# Patient Record
Sex: Male | Born: 1970
Health system: Southern US, Community
[De-identification: ages and names within clinical notes are randomized; demographics above are authoritative.]

---

## 2001-04-29 ENCOUNTER — Encounter: Payer: Self-pay | Admitting: Emergency Medicine

## 2001-04-29 ENCOUNTER — Emergency Department (HOSPITAL_COMMUNITY): Admission: EM | Admit: 2001-04-29 | Discharge: 2001-04-29 | Payer: Self-pay

## 2001-05-04 ENCOUNTER — Ambulatory Visit (HOSPITAL_COMMUNITY): Admission: RE | Admit: 2001-05-04 | Discharge: 2001-05-04 | Payer: Self-pay | Admitting: Orthopedic Surgery

## 2001-05-05 ENCOUNTER — Encounter: Payer: Self-pay | Admitting: Orthopedic Surgery

## 2001-05-05 ENCOUNTER — Ambulatory Visit (HOSPITAL_COMMUNITY): Admission: RE | Admit: 2001-05-05 | Discharge: 2001-05-05 | Payer: Self-pay | Admitting: Orthopedic Surgery

## 2005-05-03 ENCOUNTER — Emergency Department (HOSPITAL_COMMUNITY): Admission: EM | Admit: 2005-05-03 | Discharge: 2005-05-03 | Payer: Self-pay | Admitting: Emergency Medicine
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
## 2014-12-14 DIAGNOSIS — M79652 Pain in left thigh: Secondary | ICD-10-CM | POA: Diagnosis not present

## 2014-12-14 DIAGNOSIS — M25562 Pain in left knee: Secondary | ICD-10-CM | POA: Insufficient documentation

## 2014-12-14 DIAGNOSIS — M79605 Pain in left leg: Secondary | ICD-10-CM | POA: Insufficient documentation

## 2014-12-15 ENCOUNTER — Ambulatory Visit (HOSPITAL_COMMUNITY)
Admission: RE | Admit: 2014-12-15 | Discharge: 2014-12-15 | Disposition: A | Discharge status: Home / Self Care | Payer: BLUE CROSS/BLUE SHIELD | Source: Ambulatory Visit | Attending: Emergency Medicine | Admitting: Emergency Medicine

## 2014-12-15 ENCOUNTER — Encounter (HOSPITAL_COMMUNITY): Payer: Self-pay | Admitting: Emergency Medicine

## 2014-12-15 ENCOUNTER — Emergency Department (HOSPITAL_COMMUNITY)
Admission: EM | Admit: 2014-12-15 | Discharge: 2014-12-15 | Disposition: A | Discharge status: Home / Self Care | Payer: BLUE CROSS/BLUE SHIELD | Attending: Emergency Medicine | Admitting: Emergency Medicine

## 2014-12-15 DIAGNOSIS — M79605 Pain in left leg: Secondary | ICD-10-CM | POA: Diagnosis present

## 2014-12-15 DIAGNOSIS — M79609 Pain in unspecified limb: Secondary | ICD-10-CM | POA: Diagnosis not present

## 2014-12-15 MED ORDER — IBUPROFEN 600 MG PO TABS: 600.0000 mg | ORAL_TABLET | Freq: Four times a day (QID) | ORAL | Status: DC | PRN

## 2014-12-15 NOTE — ED Provider Notes (Signed)
CSN: [id number]     Arrival date & time 12/14/14  2355 History   First MD Initiated Contact with Patient 12/15/14 0003     No chief complaint on file.    (Consider location/radiation/quality/duration/timing/severity/associated sxs/prior Treatment) HPI  Pt is a 44yo male presenting to ED with c/o intermittent Left leg pain for 2 weeks. Reports mild aching, soreness, 8/10 at worst.  Pain worse with ambulation. No pain medication tried.  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 Denies recent falls or injuries. Denies swelling, redness, or numbness of Left leg. Denies chest pain or SOB. Denies abdominal pain, n/v/d. Denies fever or chills. Denies hx of blood clots. Denies smoking. No other significant PMH.  History reviewed. No pertinent past medical history. History reviewed. No pertinent past surgical history. No family history on file. History  Substance Use Topics  . Smoking status: Never Smoker   . Smokeless tobacco: Not on file  . Alcohol Use: No    Review of Systems  Constitutional: Negative for fever and chills.  Musculoskeletal: Positive for myalgias and gait problem ( occasional limp due to pain). Negative for back pain, joint swelling and arthralgias.  Skin: Negative for color change, pallor, rash and wound.  Neurological: Negative for weakness and numbness.  All other systems reviewed and are negative.     Allergies  Review of patient's allergies indicates no known allergies.  Home Medications   Prior to Admission medications   Medication Sig Start Date End Date Taking? Authorizing Provider  ibuprofen (ADVIL,MOTRIN) 600 MG tablet Take 1 tablet (600 mg total) by mouth every 6 (six) hours as needed. 12/15/14   Junius Finner, PA-C   BP 131/91 mmHg  Pulse 72  Temp(Src) 97.9 F (36.6 C) (Oral)  Resp 20  SpO2 99% Physical Exam  Constitutional: He is oriented to person, place, and time. He appears well-developed and well-nourished.  HENT:  Head:  Normocephalic and atraumatic.  Eyes: EOM are normal.  Neck: Normal range of motion.  Cardiovascular: Normal rate.   Pulses:      Dorsalis pedis pulses are 2+ on the left side.  Pulmonary/Chest: Effort normal.  Musculoskeletal: Normal range of motion. He exhibits tenderness. He exhibits no edema.  Left leg: no edema or obvious deformity.  Mild tenderness to posterior aspect Left knee and medial thigh. FROM Left hip and Left knee.  No tenderness to Left hip or Left ankle. No tenderness to Left calf.  Neurological: He is alert and oriented to person, place, and time.  Left leg: sensation in tact, symmetric compared to Right leg  Skin: Skin is warm and dry. No rash noted. No erythema. No pallor.  Psychiatric: He has a normal mood and affect. His behavior is normal.  Nursing note and vitals reviewed.   ED Course  Procedures (including critical care time) Labs Review Labs Reviewed - No data to display  Imaging Review No results found.   EKG Interpretation None      MDM   Final diagnoses:  Left leg pain   Pt is a 44yo male presenting to ED with c/o intermittent Left leg pain.  Denies CP or SOB. Pt does have mild tenderness to posterior left knee and medial thigh. No erythema, warmth or edema.  Left leg is neurovascularly in tact.  No hx of recent injury. No evidence of underlying infection.  No indication for plain films at this time.  Pain likely muscular in nature.  Low suspicion for DVT, however, due  to hx of 12-16 hour stents of driving, will have pt return in the morning for venous doppler.  Home care instructions provided. Return precautions provided. Pt verbalized understanding and agreement with tx plan.   Junius FinnerErin O'Malley, PA-C 12/15/14 [phone]  Derwood KaplanAnkit Nanavati, MD 12/16/14 [phone]

## 2014-12-15 NOTE — ED Notes (Signed)
Pt c/o LLE pain x 2 weeks; ambulatory with mild discomfort; denies injury

## 2014-12-15 NOTE — Progress Notes (Signed)
VASCULAR LAB PRELIMINARY  PRELIMINARY  PRELIMINARY  PRELIMINARY  Left lower extremity venous Doppler completed.    Preliminary report:  There is no DVT or SVT noted in the left lower extremity.   Theresa Dohrman, RVT 12/15/2014, 9:26 AM

## 2014-12-15 NOTE — ED Notes (Signed)
Pt. reports intermittent left leg pain for 2 weeks , denies injury , ambulatory .

## 2015-05-14 ENCOUNTER — Encounter (HOSPITAL_BASED_OUTPATIENT_CLINIC_OR_DEPARTMENT_OTHER): Payer: Self-pay

## 2015-05-14 ENCOUNTER — Emergency Department (HOSPITAL_BASED_OUTPATIENT_CLINIC_OR_DEPARTMENT_OTHER)
Admission: EM | Admit: 2015-05-14 | Discharge: 2015-05-14 | Disposition: A | Discharge status: Home / Self Care | Payer: BLUE CROSS/BLUE SHIELD | Attending: Emergency Medicine | Admitting: Emergency Medicine

## 2015-05-14 DIAGNOSIS — M25512 Pain in left shoulder: Secondary | ICD-10-CM

## 2015-05-14 MED ORDER — HYDROCODONE-ACETAMINOPHEN 5-325 MG PO TABS: 1.0000 | ORAL_TABLET | Freq: Four times a day (QID) | ORAL | Status: AC | PRN

## 2015-05-14 MED ORDER — NAPROXEN SODIUM 550 MG PO TABS: ORAL_TABLET | ORAL | Status: AC

## 2015-05-14 NOTE — ED Notes (Signed)
Left shoulder pain after waking up on Monday, increasing pain that is moving down his arm and c/o decreased ROM.

## 2015-05-14 NOTE — ED Provider Notes (Signed)
CSN: [id number]     Arrival date & time 05/14/15  2107 History  By signing my name below, I, [name], attest that this documentation has been prepared under the direction and in the presence of Paula LibraJohn Isaiah Cianci, MD. Electronically Signed: Evon Slackerrance Branch, ED Scribe. 05/14/2015. 11:02 PM.      Chief Complaint  Patient presents with  . Shoulder Pain   Patient is a 44 y.o. male presenting with shoulder pain. The history is provided by the patient. No language interpreter was used.  Shoulder Pain  HPI Comments: Matthew Schneider is a 44 y.o. male who presents to the Emergency Department complaining of left arm pain onset 2 days prior. Pt states that he awoke and there was pain. Pt denies injury or trauma to the shoulder. Pt states that when lifting the arm the pain is severe. He has difficulty localizing the pain but states it is not really in the shoulder but in the entire left arm. There is some equivocal change in pain with movement of the neck but movement of the shoulders for possible exacerbating factor. Pt states that he has tried Aleve and [employer] that provided temporary relief. There is no associated numbness or weakness.   History reviewed. No pertinent past medical history. History reviewed. No pertinent past surgical history. No family history on file. Social History  Substance Use Topics  . Smoking status: Never Smoker   . Smokeless tobacco: None  . Alcohol Use: No    Review of Systems  Musculoskeletal: Positive for myalgias.  Neurological: Negative for numbness.  All other systems reviewed and are negative.   Allergies  Review of patient's allergies indicates no known allergies.  Home Medications   Prior to Admission medications   Medication Sig Start Date End Date Taking? Authorizing Provider  HYDROcodone-acetaminophen (NORCO) 5-325 MG tablet Take 1-2 tablets by mouth every 6 (six) hours as needed for severe pain. 05/14/15   Cyndia Degraff, MD  naproxen sodium  (ANAPROX DS) 550 MG tablet Take 1 tablet every 12 hours for shoulder pain. Best taken with a meal. 05/14/15   Deontra Pereyra, MD   BP 130/86 mmHg  Pulse 72  Temp(Src) 98.2 F (36.8 C) (Oral)  Resp 20  Ht 6\' 1"  (1.854 m)  Wt 215 lb (97.523 kg)  BMI 28.37 kg/m2  SpO2 97%   Physical Exam  Nursing note and vitals reviewed. General: Well-developed, well-nourished male in no acute distress; appearance consistent with age of record HENT: normocephalic; atraumatic Eyes: pupils equal, round and reactive to light; extraocular muscles intact Neck: supple Heart: regular rate and rhythm Lungs: clear to auscultation bilaterally Abdomen: soft; nondistended; nontender; no masses or hepatosplenomegaly; bowel sounds present Extremities: No deformity; full range of motion except left shoulder due to pain; pulses normal; no edema or discoloration of the left shoulder or arm and the left upper extremity is NVI distally Neurologic: Awake, alert and oriented; motor function intact in all extremities and symmetric; no facial droop Skin: Warm and dry Psychiatric: Normal mood and affect  ED Course  Procedures (including critical care time) DIAGNOSTIC STUDIES: Oxygen Saturation is 97% on RA, normal by my interpretation.    COORDINATION OF CARE: 10:57 PM-Discussed treatment plan with pt at bedside and pt agreed to plan.   Patient will follow-up with [facility]   MDM   Final diagnoses:  Shoulder pain, acute, left   I personally performed the services described in this documentation, which was scribed in my presence. The recorded information  has been reviewed and is accurate.     Paula Libra, MD 05/14/15 [phone]

## 2015-05-14 NOTE — ED Notes (Signed)
Woke w left shoulder pain on Sunday  Getting worse,  Denies inj

## 2018-01-26 ENCOUNTER — Ambulatory Visit (INDEPENDENT_AMBULATORY_CARE_PROVIDER_SITE_OTHER): Payer: Self-pay | Admitting: Orthopaedic Surgery

## 2018-11-03 ENCOUNTER — Other Ambulatory Visit: Payer: Self-pay | Admitting: Gastroenterology

## 2018-11-03 DIAGNOSIS — R1902 Left upper quadrant abdominal swelling, mass and lump: Secondary | ICD-10-CM

## 2018-11-09 ENCOUNTER — Ambulatory Visit
Admission: RE | Admit: 2018-11-09 | Discharge: 2018-11-09 | Disposition: A | Discharge status: Home / Self Care | Payer: BLUE CROSS/BLUE SHIELD | Source: Ambulatory Visit | Attending: Gastroenterology | Admitting: Gastroenterology

## 2018-11-09 ENCOUNTER — Other Ambulatory Visit: Payer: Self-pay

## 2018-11-09 DIAGNOSIS — R1902 Left upper quadrant abdominal swelling, mass and lump: Secondary | ICD-10-CM

## 2018-11-09 MED ORDER — IOPAMIDOL (ISOVUE-300) INJECTION 61%
125.0000 mL | Freq: Once | INTRAVENOUS | Status: AC | PRN
  Administered 2018-11-09: 14:00:00 125 mL via INTRAVENOUS

## 2019-10-20 ENCOUNTER — Ambulatory Visit: Payer: BC Managed Care – PPO | Attending: Internal Medicine

## 2019-10-20 DIAGNOSIS — Z23 Encounter for immunization: Secondary | ICD-10-CM

## 2019-10-20 NOTE — Progress Notes (Signed)
   Covid-19 Vaccination Clinic  Name:  Matthew Schneider    MRN: [id number] DOB: 07-14-1971  10/20/2019  [name] was observed post Covid-19 immunization for 15 minutes without incident. He was provided with Vaccine Information Sheet and instruction to access the V-Safe system.   [name] was instructed to call 911 with any severe reactions post vaccine: Marland Kitchen Difficulty breathing  . Swelling of face and throat  . A fast heartbeat  . A bad rash all over body  . Dizziness and weakness   Immunizations Administered    Name Date Dose VIS Date Route   Pfizer COVID-19 Vaccine 10/20/2019  9:33 AM 0.3 mL 06/29/2019 Intramuscular   Manufacturer: [employer], [employer]   Lot: [id number]   NDC: [address]-7

## 2019-11-13 ENCOUNTER — Ambulatory Visit: Payer: BC Managed Care – PPO | Attending: Internal Medicine

## 2019-11-13 DIAGNOSIS — Z23 Encounter for immunization: Secondary | ICD-10-CM

## 2019-11-13 NOTE — Progress Notes (Signed)
   Covid-19 Vaccination Clinic  Name:  Matthew Schneider    MRN: [id number] DOB: 1971-01-23  11/13/2019  Mr. Volante was observed post Covid-19 immunization for 15 minutes without incident. He was provided with Vaccine Information Sheet and instruction to access the V-Safe system.   Mr. O[place]aca was instructed to call 911 with any severe reactions post vaccine: Marland Kitchen Difficulty breathing  . Swelling of face and throat  . A fast heartbeat  . A bad rash all over body  . Dizziness and weakness   Immunizations Administered    Name Date Dose VIS Date Route   Pfizer COVID-19 Vaccine 11/13/2019  4:22 PM 0.3 mL 09/12/2018 Intramuscular   Manufacturer: [employer], [employer]   Lot: [id number]   NDC: [address]-5

## 2020-03-16 IMAGING — CT CT ABDOMEN WITH CONTRAST
2 of 3 series · 13 of 32 positions shown, 19 images · IV contrast (APPLIED)
Comparison: None.

CLINICAL DATA: Left upper quadrant abdominal mass.

EXAM:
CT ABDOMEN WITH CONTRAST
TECHNIQUE: Multidetector CT imaging of the abdomen was performed using the
standard protocol following bolus administration of intravenous
contrast.
CONTRAST:  125mL [id number] IOPAMIDOL ([id number]) INJECTION 61%

[Series 2: abdroutine 5.0 i40s 1 · axial · 0.80mm/px · z∈[-323,-83]mm · 9 of 60 slices shown, 15 images]
[im 6/60  soft-tissue]
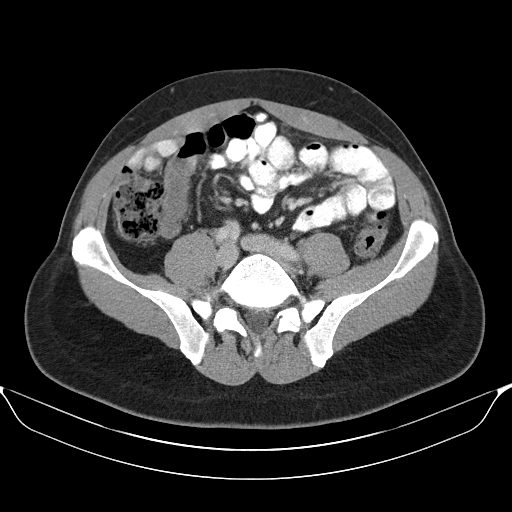
[im 6/60  bone]
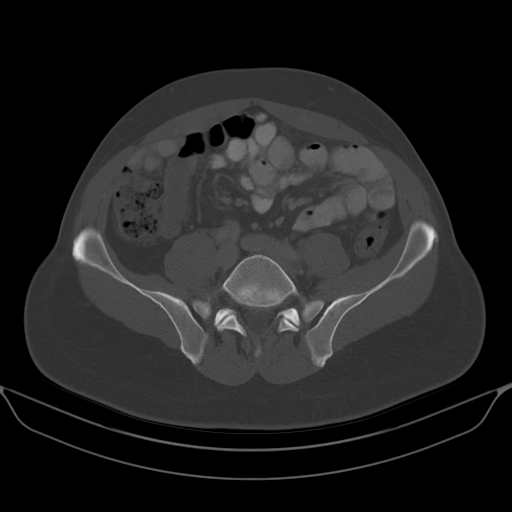
[im 12/60  soft-tissue]
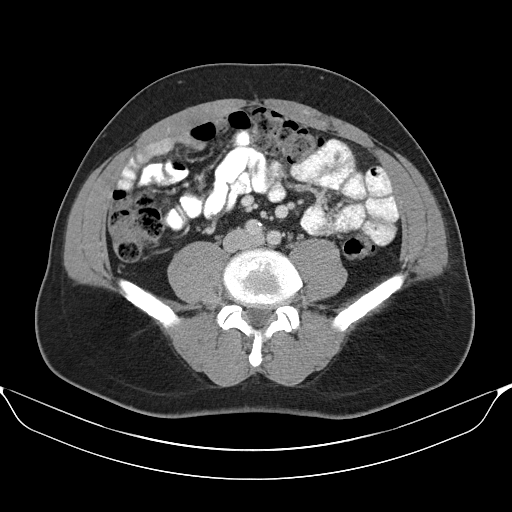
[im 18/60  soft-tissue]
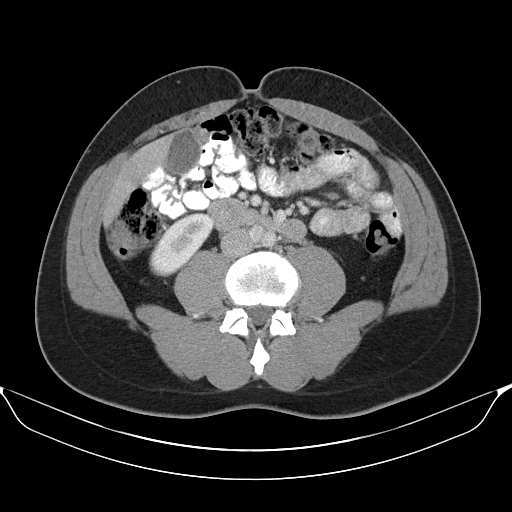
[im 24/60  soft-tissue]
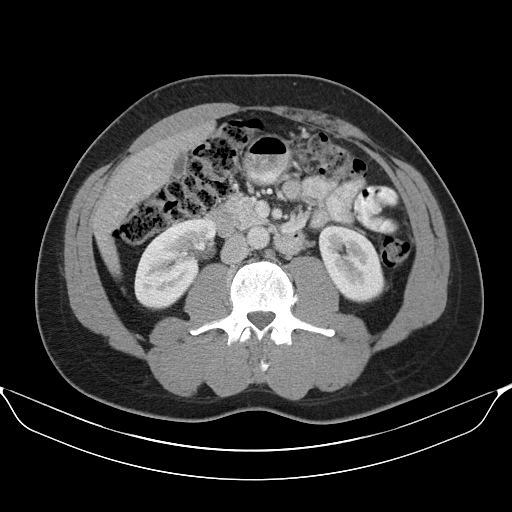
[im 30/60  soft-tissue]
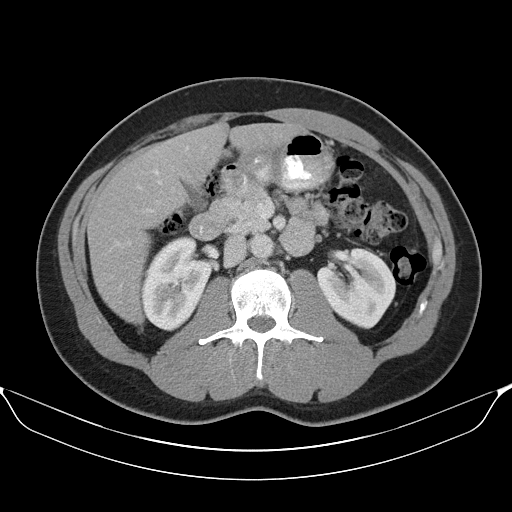
[im 36/60  soft-tissue]
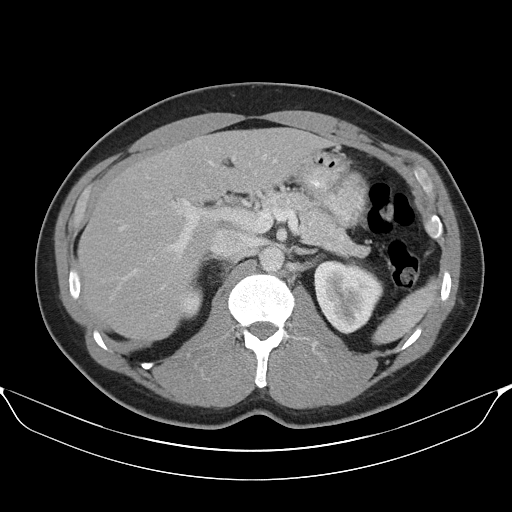
[im 36/60  lung]
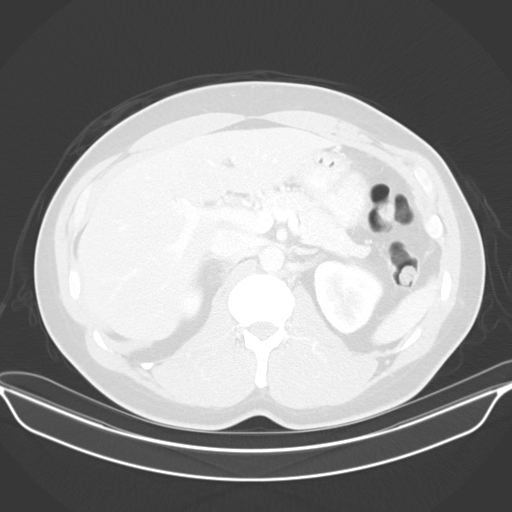
[im 42/60  soft-tissue]
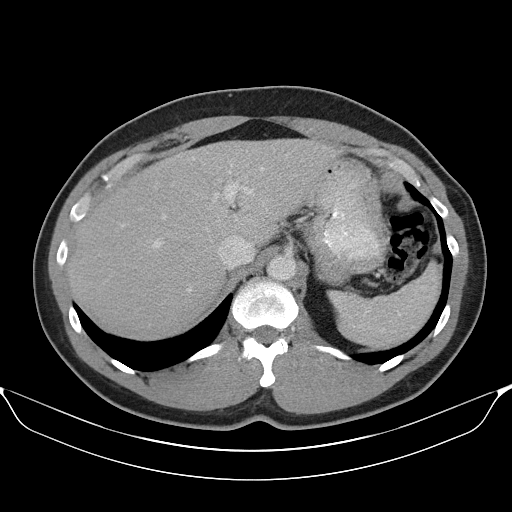
[im 42/60  lung]
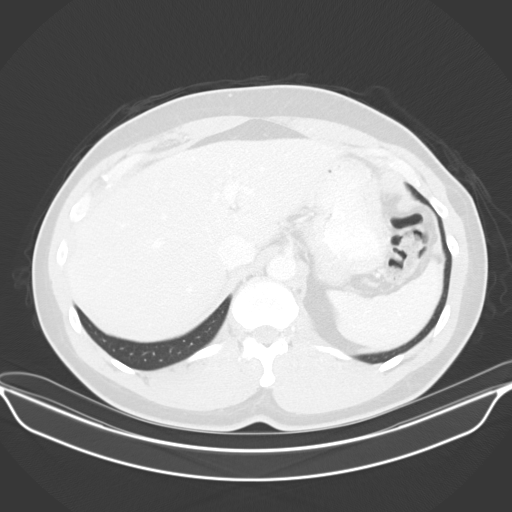
[im 48/60  soft-tissue]
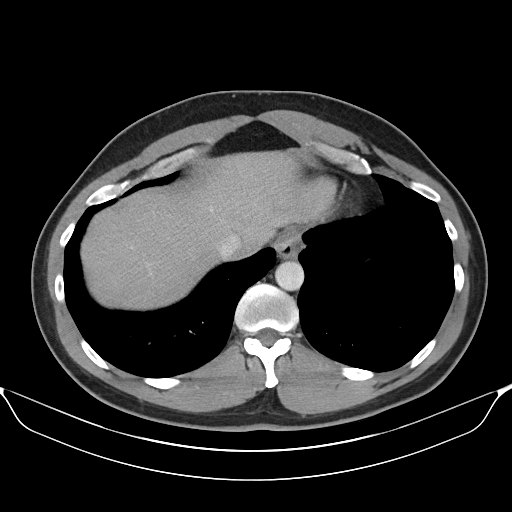
[im 48/60  lung]
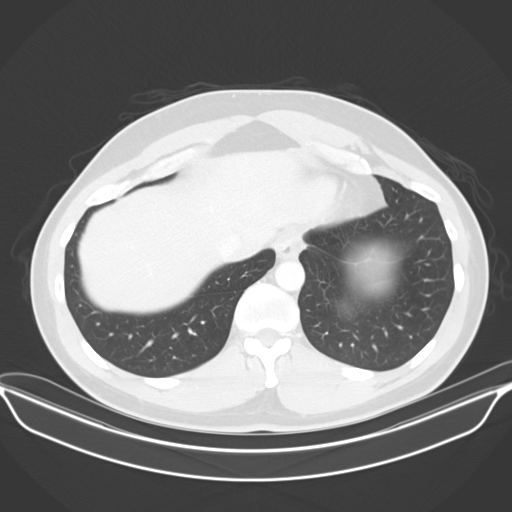
[im 54/60  soft-tissue]
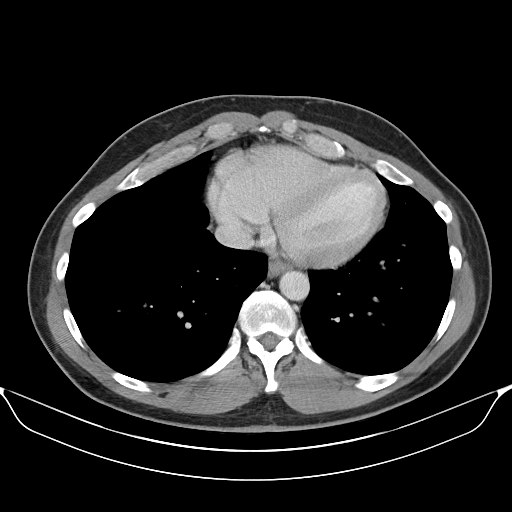
[im 54/60  lung]
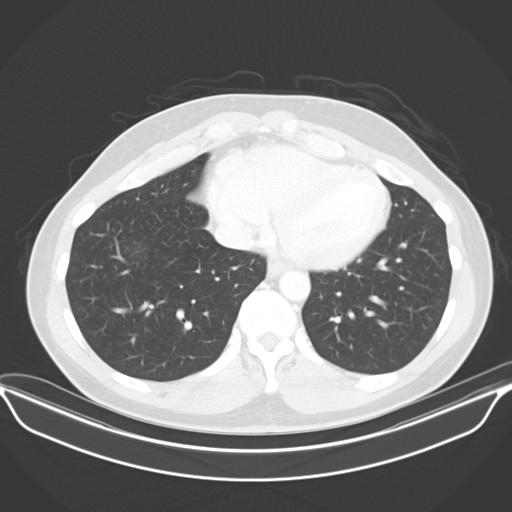
[im 54/60  bone]
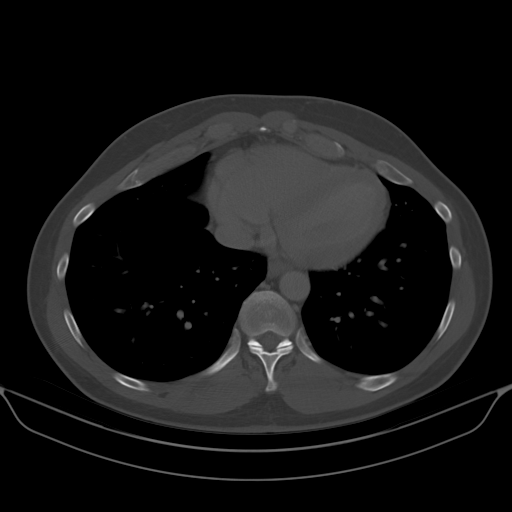

[Series 5: lungs · axial · 0.80mm/px · z∈[-187,-131]mm · 4 of 73 slices shown]
[im 6/73  soft-tissue]
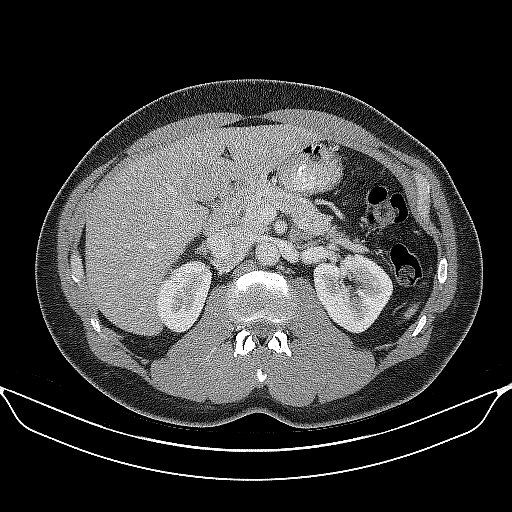
[im 17/73  soft-tissue]
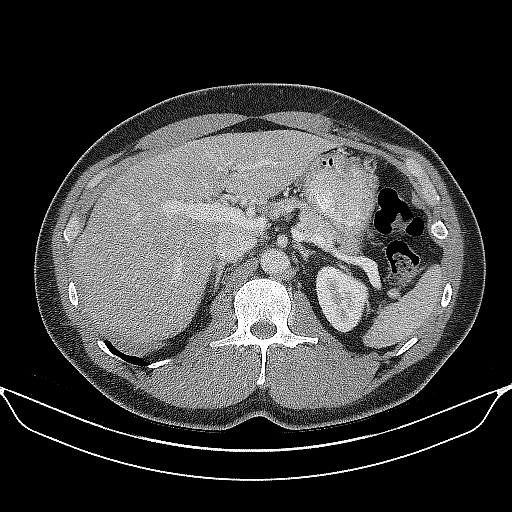
[im 23/73  soft-tissue]
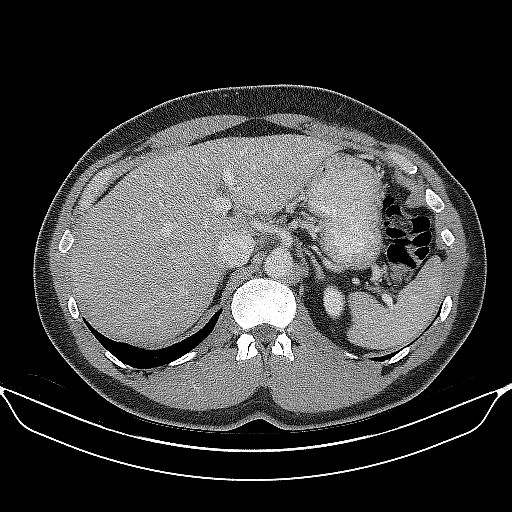
[im 34/73  soft-tissue]
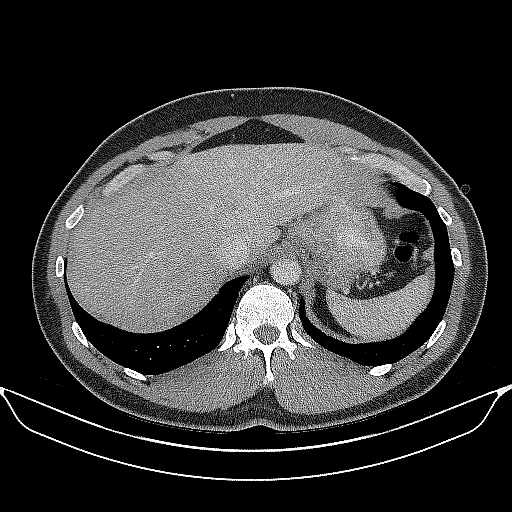

[13 of 32 positions shown; findings below may reference images not displayed]

FINDINGS: Lower chest: No acute abnormality.

Hepatobiliary: No focal liver abnormality is seen. No gallstones,
gallbladder wall thickening, or biliary dilatation.

Pancreas: Unremarkable. No pancreatic ductal dilatation or
surrounding inflammatory changes.

Spleen: Normal in size without focal abnormality.

Adrenals/Urinary Tract: Adrenal glands are unremarkable. Kidneys are
normal, without renal calculi, focal lesion, or hydronephrosis.

Stomach/Bowel: Stomach is within normal limits. No evidence of bowel
wall thickening, distention, or inflammatory changes.

Vascular/Lymphatic: Minimal aortoiliac atherosclerosis. No enlarged
abdominal lymph nodes.

Other: None.

Musculoskeletal: There is a 2.7 x 1.1 x 2.7 cm simple lipoma along
the superficial left lower lateral chest wall, corresponding to the
palpable abnormality. No acute or significant osseous findings.
IMPRESSION: 1. 2.7 cm simple lipoma along the left lower lateral chest wall,
corresponding to the palpable abnormality.
2.  No acute intra-abdominal process.

## 2020-07-14 ENCOUNTER — Ambulatory Visit: Payer: BC Managed Care – PPO | Attending: Internal Medicine

## 2020-07-14 ENCOUNTER — Other Ambulatory Visit (HOSPITAL_BASED_OUTPATIENT_CLINIC_OR_DEPARTMENT_OTHER): Payer: Self-pay | Admitting: Internal Medicine

## 2020-07-14 DIAGNOSIS — Z23 Encounter for immunization: Secondary | ICD-10-CM

## 2020-07-14 MED FILL — PFIZER-BIONTECH COVID-19 VA: 30 | 21 days supply | Qty: 0 | Fill #0

## 2020-07-14 NOTE — Progress Notes (Signed)
   Covid-19 Vaccination Clinic  Name:  SIDHANT HELDERMAN    MRN: [id number] DOB: 12-29-1970  07/14/2020  Mr. Bachus was observed post Covid-19 immunization for 15 minutes without incident. He was provided with Vaccine Information Sheet and instruction to access the V-Safe system.   Mr. Gatt was instructed to call 911 with any severe reactions post vaccine: Marland Kitchen Difficulty breathing  . Swelling of face and throat  . A fast heartbeat  . A bad rash all over body  . Dizziness and weakness   Immunizations Administered    Name Date Dose VIS Date Route   Pfizer COVID-19 Vaccine 07/14/2020  2:44 PM 0.3 mL 05/07/2020 Intramuscular   Manufacturer: [employer], [employer]   Lot: [id number]   NDC: [address]-8

## 2024-08-09 ENCOUNTER — Ambulatory Visit: Admitting: Physician Assistant
